# Patient Record
Sex: Male | Born: 2000 | Race: Black or African American | Hispanic: No | Marital: Single | Smoking: Never smoker
Health system: Southern US, Community
[De-identification: ages and names within clinical notes are randomized; demographics above are authoritative.]

---
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
## 2019-07-05 ENCOUNTER — Other Ambulatory Visit: Payer: Self-pay

## 2019-07-05 ENCOUNTER — Encounter (HOSPITAL_COMMUNITY): Payer: Self-pay | Admitting: Emergency Medicine

## 2019-07-05 ENCOUNTER — Emergency Department (HOSPITAL_COMMUNITY)
Admission: EM | Admit: 2019-07-05 | Discharge: 2019-07-05 | Disposition: A | Discharge status: Home / Self Care | Payer: BC Managed Care – PPO | Attending: Emergency Medicine | Admitting: Emergency Medicine

## 2019-07-05 DIAGNOSIS — R0789 Other chest pain: Secondary | ICD-10-CM | POA: Diagnosis not present

## 2019-07-05 DIAGNOSIS — R002 Palpitations: Secondary | ICD-10-CM

## 2019-07-05 DIAGNOSIS — R000 Tachycardia, unspecified: Secondary | ICD-10-CM | POA: Diagnosis not present

## 2019-07-05 NOTE — ED Provider Notes (Signed)
[facility] EMERGENCY DEPARTMENT Provider Note   CSN: [id number] Arrival date & time: 07/05/19  0845     History Chief Complaint  Patient presents with  . Tachycardia    John Anthony is a 19 y.o. male.  Patient awoke at 8 AM today and had sharp middle chest pain and dyspnea briefly.  Felt his heart was beating quickly.  Symptoms resolved.  No chronic health problems.  No prodromal illnesses.  No fever, chills, sweats.  Takes no medication at home.  Social history Electronics engineer at [employer]        History reviewed. No pertinent past medical history.  There are no problems to display for this patient.   History reviewed. No pertinent surgical history.     No family history on file.  Social History   Tobacco Use  . Smoking status: Never Smoker  . Smokeless tobacco: Never Used  Substance Use Topics  . Alcohol use: Not Currently  . Drug use: Not Currently    Home Medications Prior to Admission medications   Not on File    Allergies    Patient has no known allergies.  Review of Systems   Review of Systems  All other systems reviewed and are negative.   Physical Exam Updated Vital Signs Ht 6\' 4"  (1.93 m)   Wt 122.5 kg   BMI 32.87 kg/m   Physical Exam Vitals and nursing note reviewed.  Constitutional:      Appearance: He is well-developed.  HENT:     Head: Normocephalic and atraumatic.  Eyes:     Conjunctiva/sclera: Conjunctivae normal.  Cardiovascular:     Rate and Rhythm: Normal rate and regular rhythm.  Pulmonary:     Effort: Pulmonary effort is normal.     Breath sounds: Normal breath sounds.  Abdominal:     General: Bowel sounds are normal.     Palpations: Abdomen is soft.  Musculoskeletal:        General: Normal range of motion.     Cervical back: Neck supple.  Skin:    General: Skin is warm and dry.  Neurological:     General: No focal deficit present.     Mental Status: He is alert and oriented to person, place, and time.    Psychiatric:        Behavior: Behavior normal.     ED Results / Procedures / Treatments   Labs (all labs ordered are listed, but only abnormal results are displayed) Labs Reviewed - No data to display  EKG EKG Interpretation  Date/Time:  Thursday July 05 2019 09:01:59 EDT Ventricular Rate:  79 PR Interval:    QRS Duration: 87 QT Interval:  357 QTC Calculation: 410 R Axis:   65 Text Interpretation: Sinus rhythm Borderline ST elevation, lateral leads Confirmed by Nat Christen [phone]) on 07/05/2019 9:49:15 AM   Radiology No results found.  Procedures Procedures (including critical care time)  Medications Ordered in ED Medications - No data to display  ED Course  I have reviewed the triage vital signs and the nursing notes.  Pertinent labs & imaging results that were available during my care of the patient were reviewed by me and considered in my medical decision making (see chart for details).    MDM Rules/Calculators/A&P                      Vital signs are normal.  Normal physical exam.  EKG normal.  Patient is low risk for  ACS or PE. Final Clinical Impression(s) / ED Diagnoses Final diagnoses:  Palpitations    Rx / DC Orders ED Discharge Orders    None       Donnetta Hutching, MD 07/05/19 [phone]

## 2019-07-05 NOTE — ED Notes (Signed)
ED Provider at bedside. 

## 2019-07-05 NOTE — Discharge Instructions (Addendum)
EKG was totally normal.  If symptoms persist, follow-up with your primary care doctor.

## 2019-07-05 NOTE — ED Triage Notes (Signed)
PT states he woke up around 0800 this morning and felt like he was breathing hard, had sharp middle chest pain and could feel his heart beating fast so he called his father.PT states it resolved after laying back down in the bed and denies any chest pain at this time.

## 2019-07-16 DIAGNOSIS — F419 Anxiety disorder, unspecified: Secondary | ICD-10-CM | POA: Diagnosis not present

## 2019-07-16 DIAGNOSIS — R079 Chest pain, unspecified: Secondary | ICD-10-CM | POA: Diagnosis not present

## 2019-07-16 DIAGNOSIS — F410 Panic disorder [episodic paroxysmal anxiety] without agoraphobia: Secondary | ICD-10-CM | POA: Diagnosis not present

## 2019-07-16 DIAGNOSIS — M940 Chondrocostal junction syndrome [Tietze]: Secondary | ICD-10-CM | POA: Diagnosis not present

## 2019-07-19 DIAGNOSIS — Z1389 Encounter for screening for other disorder: Secondary | ICD-10-CM | POA: Diagnosis not present

## 2019-07-19 DIAGNOSIS — Z68.41 Body mass index (BMI) pediatric, less than 5th percentile for age: Secondary | ICD-10-CM | POA: Diagnosis not present

## 2019-07-19 DIAGNOSIS — Z0001 Encounter for general adult medical examination with abnormal findings: Secondary | ICD-10-CM | POA: Diagnosis not present

## 2019-07-19 DIAGNOSIS — F419 Anxiety disorder, unspecified: Secondary | ICD-10-CM | POA: Diagnosis not present

## 2020-04-28 ENCOUNTER — Other Ambulatory Visit: Payer: Self-pay

## 2020-04-28 ENCOUNTER — Other Ambulatory Visit (HOSPITAL_COMMUNITY): Payer: Self-pay | Admitting: Physician Assistant

## 2020-04-28 ENCOUNTER — Ambulatory Visit (HOSPITAL_COMMUNITY)
Admission: RE | Admit: 2020-04-28 | Discharge: 2020-04-28 | Disposition: A | Discharge status: Home / Self Care | Payer: BC Managed Care – PPO | Source: Ambulatory Visit | Attending: Physician Assistant | Admitting: Physician Assistant

## 2020-04-28 DIAGNOSIS — M25512 Pain in left shoulder: Secondary | ICD-10-CM | POA: Insufficient documentation

## 2020-04-28 DIAGNOSIS — R0602 Shortness of breath: Secondary | ICD-10-CM | POA: Diagnosis not present

## 2020-04-28 DIAGNOSIS — Z68.41 Body mass index (BMI) pediatric, less than 5th percentile for age: Secondary | ICD-10-CM | POA: Diagnosis not present

## 2020-04-28 DIAGNOSIS — M25511 Pain in right shoulder: Secondary | ICD-10-CM | POA: Diagnosis not present

## 2020-04-28 DIAGNOSIS — R0789 Other chest pain: Secondary | ICD-10-CM

## 2020-04-28 DIAGNOSIS — R079 Chest pain, unspecified: Secondary | ICD-10-CM | POA: Diagnosis not present

## 2020-04-29 DIAGNOSIS — F411 Generalized anxiety disorder: Secondary | ICD-10-CM | POA: Diagnosis not present

## 2020-04-30 DIAGNOSIS — F329 Major depressive disorder, single episode, unspecified: Secondary | ICD-10-CM | POA: Diagnosis not present

## 2020-05-01 DIAGNOSIS — F329 Major depressive disorder, single episode, unspecified: Secondary | ICD-10-CM | POA: Diagnosis not present

## 2020-05-05 ENCOUNTER — Other Ambulatory Visit: Payer: Self-pay

## 2020-05-05 ENCOUNTER — Ambulatory Visit (HOSPITAL_COMMUNITY): Payer: BC Managed Care – PPO | Admitting: Clinical

## 2020-05-05 ENCOUNTER — Telehealth (HOSPITAL_COMMUNITY): Payer: Self-pay | Admitting: Clinical

## 2020-05-05 NOTE — Telephone Encounter (Signed)
PT did not respond to video link or phone call and missed their scheduled appointment.

## 2020-05-07 ENCOUNTER — Telehealth (HOSPITAL_COMMUNITY): Payer: Self-pay | Admitting: Clinical

## 2020-05-07 NOTE — Telephone Encounter (Signed)
Called to schedule f/u appt left vm 

## 2020-05-14 DIAGNOSIS — F329 Major depressive disorder, single episode, unspecified: Secondary | ICD-10-CM | POA: Diagnosis not present

## 2021-10-24 IMAGING — DX DG SHOULDER 2+V*R*
3 series · 3 of 3 positions shown · non-contrast
Comparison: None.

CLINICAL DATA: Right shoulder pain

EXAM:
RIGHT SHOULDER - 2+ VIEW

[shoulder grashey]
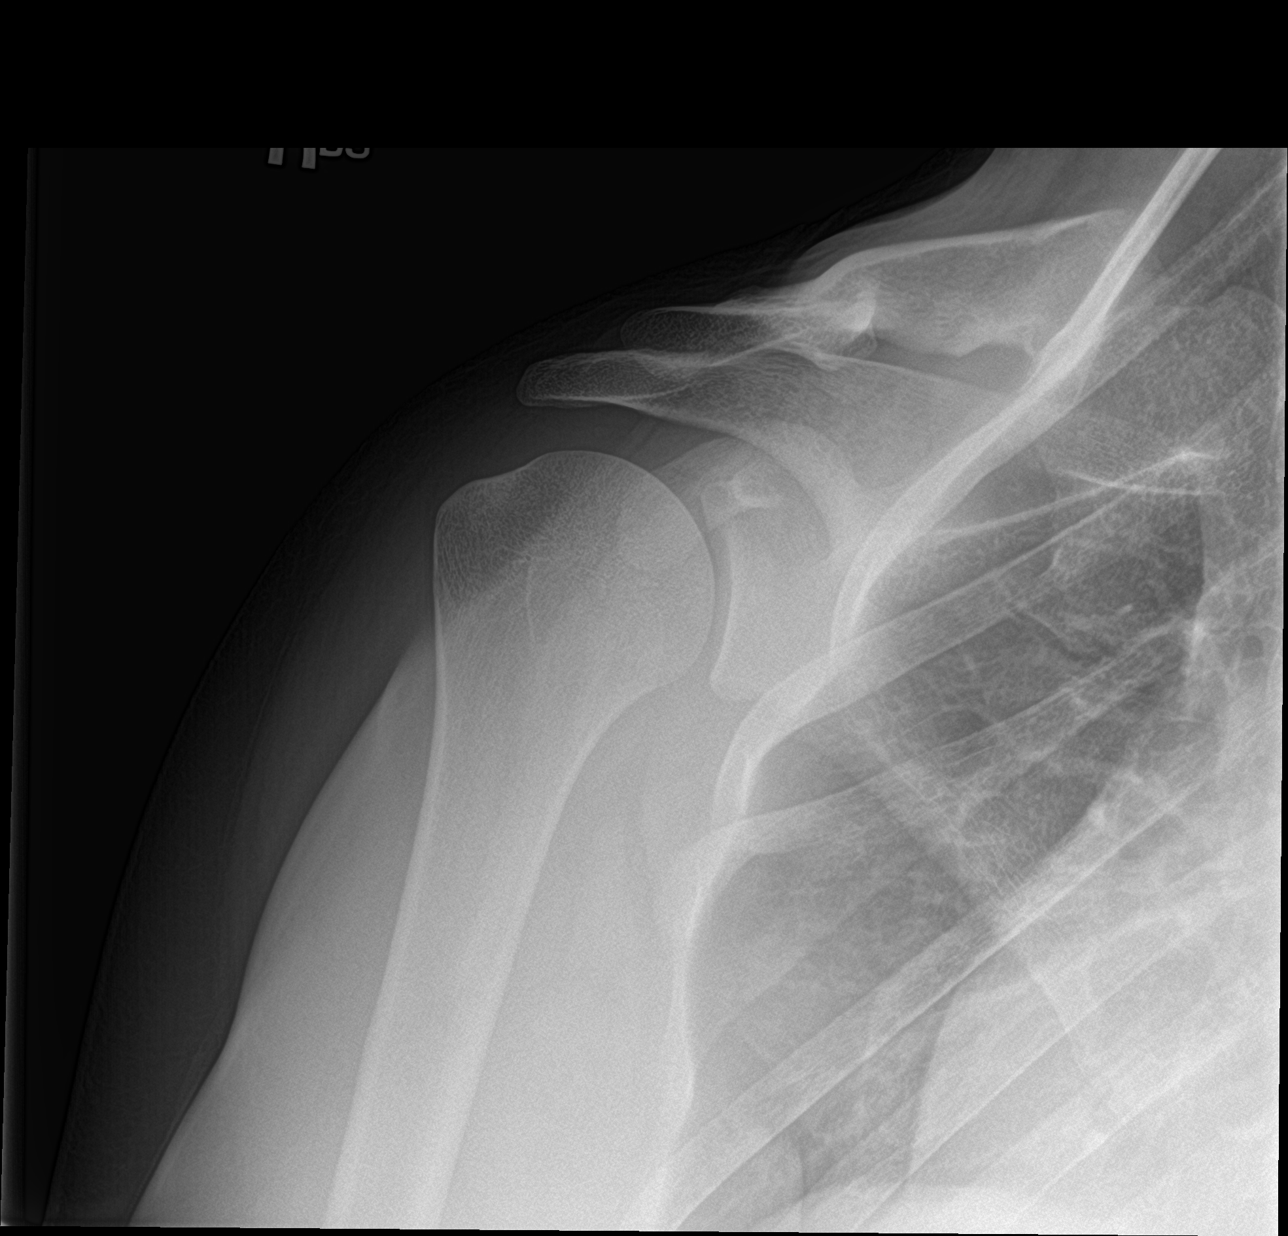

[shoulder y view]
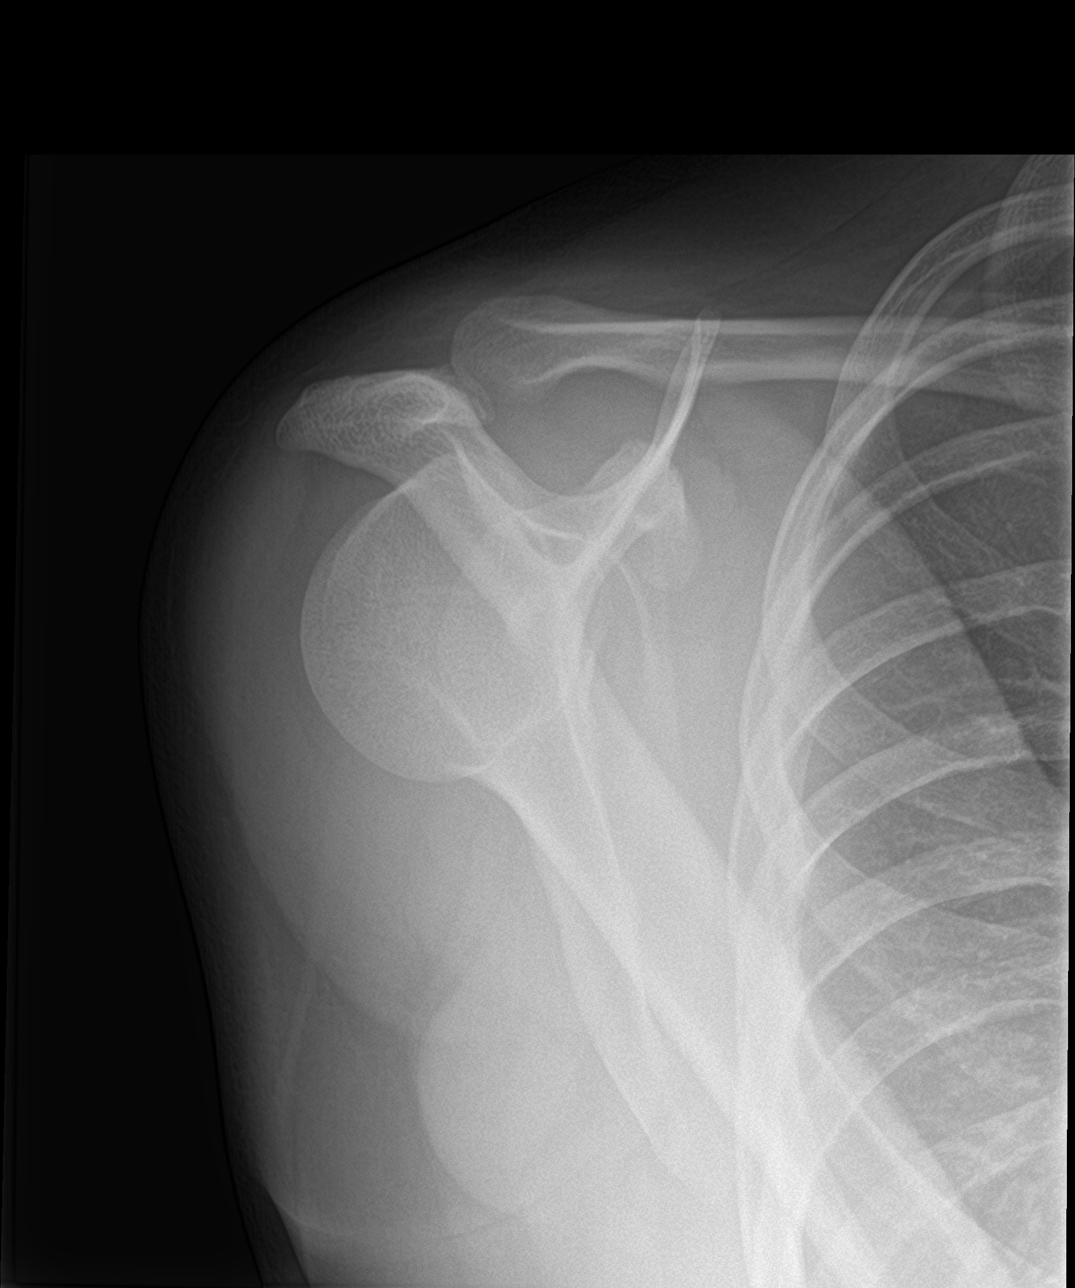

[shoulder axillary]
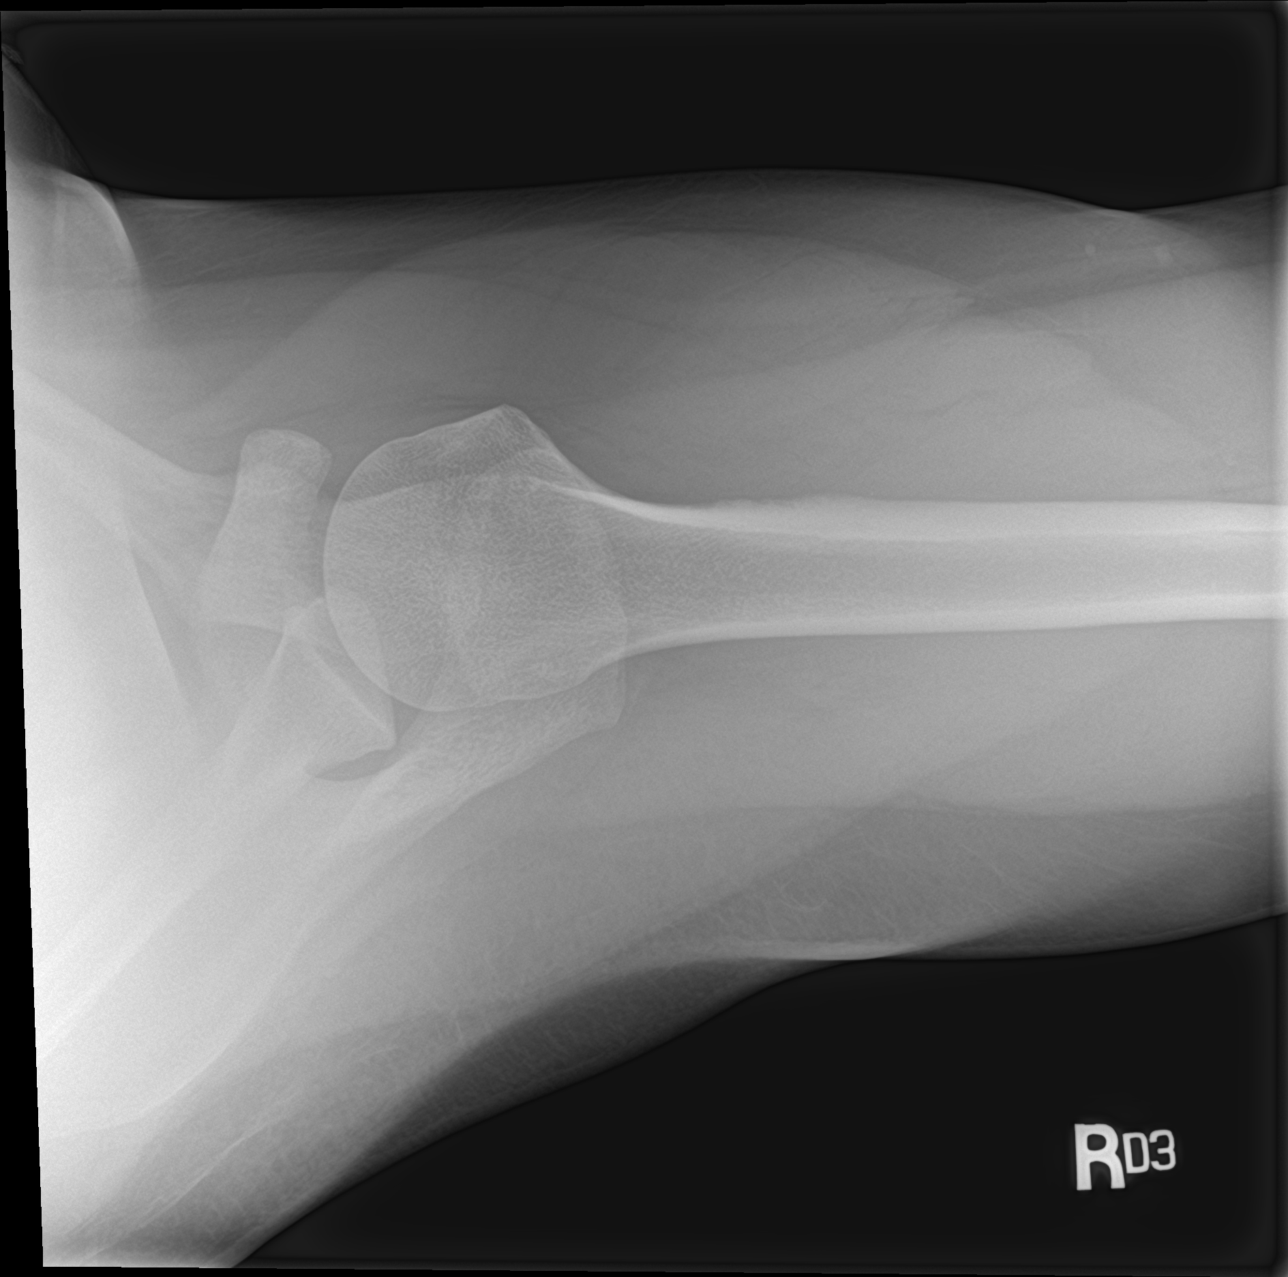

[3 of 3 positions shown; findings below may reference images not displayed]

FINDINGS: There is no evidence of fracture or dislocation. There is no
evidence of arthropathy or other focal bone abnormality. Soft
tissues are unremarkable.
IMPRESSION: Negative.
